# Patient Record
Sex: Male | Born: 1970 | Race: White | Hispanic: No | Marital: Married | Smoking: Former smoker
Health system: Southern US, Community
[De-identification: ages and names within clinical notes are randomized; demographics above are authoritative.]

## PROBLEM LIST (undated history)

## (undated) DIAGNOSIS — J40 Bronchitis, not specified as acute or chronic: Secondary | ICD-10-CM

---

## 2011-02-12 ENCOUNTER — Emergency Department (HOSPITAL_COMMUNITY): Payer: Managed Care, Other (non HMO)

## 2011-02-12 ENCOUNTER — Inpatient Hospital Stay (HOSPITAL_COMMUNITY)
Admission: EM | Admit: 2011-02-12 | Discharge: 2011-02-15 | DRG: 189 | Disposition: A | Discharge status: Home / Self Care | Payer: Managed Care, Other (non HMO) | Attending: Internal Medicine | Admitting: Internal Medicine

## 2011-02-12 DIAGNOSIS — F17200 Nicotine dependence, unspecified, uncomplicated: Secondary | ICD-10-CM | POA: Diagnosis present

## 2011-02-12 DIAGNOSIS — J9620 Acute and chronic respiratory failure, unspecified whether with hypoxia or hypercapnia: Principal | ICD-10-CM | POA: Diagnosis present

## 2011-02-12 DIAGNOSIS — J441 Chronic obstructive pulmonary disease with (acute) exacerbation: Secondary | ICD-10-CM | POA: Diagnosis present

## 2011-02-12 DIAGNOSIS — E662 Morbid (severe) obesity with alveolar hypoventilation: Secondary | ICD-10-CM | POA: Diagnosis present

## 2011-02-12 DIAGNOSIS — Z6841 Body Mass Index (BMI) 40.0 and over, adult: Secondary | ICD-10-CM

## 2011-02-12 DIAGNOSIS — G4733 Obstructive sleep apnea (adult) (pediatric): Secondary | ICD-10-CM | POA: Diagnosis present

## 2011-02-12 DIAGNOSIS — T380X5A Adverse effect of glucocorticoids and synthetic analogues, initial encounter: Secondary | ICD-10-CM | POA: Diagnosis present

## 2011-02-12 DIAGNOSIS — R7309 Other abnormal glucose: Secondary | ICD-10-CM | POA: Diagnosis present

## 2011-02-12 DIAGNOSIS — R609 Edema, unspecified: Secondary | ICD-10-CM | POA: Diagnosis present

## 2011-02-12 DIAGNOSIS — I517 Cardiomegaly: Secondary | ICD-10-CM | POA: Diagnosis present

## 2011-02-12 LAB — CBC
HCT: 52.3 % — ABNORMAL HIGH (ref 39.0–52.0)
MCHC: 32.3 g/dL (ref 30.0–36.0)
Platelets: 183 10*3/uL (ref 150–400)
RDW: 14.4 % (ref 11.5–15.5)
WBC: 13.2 10*3/uL — ABNORMAL HIGH (ref 4.0–10.5)

## 2011-02-12 LAB — BASIC METABOLIC PANEL
BUN: 10 mg/dL (ref 6–23)
CO2: 38 mEq/L — ABNORMAL HIGH (ref 19–32)
Calcium: 9.3 mg/dL (ref 8.4–10.5)
Creatinine, Ser: 0.9 mg/dL (ref 0.4–1.5)
GFR calc non Af Amer: >60 mL/min (ref >60)
Glucose, Bld: 109 mg/dL — ABNORMAL HIGH (ref 70–99)
Sodium: 137 mEq/L (ref 135–145)

## 2011-02-12 LAB — DIFFERENTIAL
Basophils Absolute: 0 10*3/uL (ref 0.0–0.1)
Basophils Relative: 0 % (ref 0–1)
Eosinophils Absolute: 0.1 10*3/uL (ref 0.0–0.7)
Eosinophils Relative: 1 % (ref 0–5)
Lymphocytes Relative: 12 % (ref 12–46)
Monocytes Absolute: 1.6 10*3/uL — ABNORMAL HIGH (ref 0.1–1.0)

## 2011-02-12 LAB — BLOOD GAS, ARTERIAL
Acid-Base Excess: 9.5 mmol/L — ABNORMAL HIGH (ref 0.0–2.0)
Bicarbonate: 37.3 mEq/L — ABNORMAL HIGH (ref 20.0–24.0)
Bicarbonate: 35.7 mEq/L — ABNORMAL HIGH (ref 20.0–24.0)
Delivery systems: POSITIVE
Expiratory PAP: 5
O2 Saturation: 94.8 %
Patient temperature: 37
Patient temperature: 37
RATE: 16 resp/min
TCO2: 82.4 mmol/L (ref 0–100)
pO2, Arterial: 82.4 mmHg (ref 80.0–100.0)

## 2011-02-12 LAB — PRO B NATRIURETIC PEPTIDE: Pro B Natriuretic peptide (BNP): 828.9 pg/mL — ABNORMAL HIGH (ref 0–125)

## 2011-02-12 LAB — CK TOTAL AND CKMB (NOT AT ARMC)
CK, MB: 5.2 ng/mL — ABNORMAL HIGH (ref 0.3–4.0)
Total CK: 150 U/L (ref 7–232)

## 2011-02-12 MED ORDER — IOHEXOL 350 MG/ML SOLN
120.0000 mL | Freq: Once | INTRAVENOUS | Status: AC | PRN
  Administered 2011-02-12: 120 mL via INTRAVENOUS

## 2011-02-13 ENCOUNTER — Inpatient Hospital Stay (HOSPITAL_COMMUNITY): Payer: Managed Care, Other (non HMO)

## 2011-02-13 DIAGNOSIS — I517 Cardiomegaly: Secondary | ICD-10-CM

## 2011-02-13 LAB — BLOOD GAS, ARTERIAL
Acid-Base Excess: 11.8 mmol/L — ABNORMAL HIGH (ref 0.0–2.0)
Bicarbonate: 37.8 mEq/L — ABNORMAL HIGH (ref 20.0–24.0)
Expiratory PAP: 6
Mode: POSITIVE
Patient temperature: 37

## 2011-02-13 LAB — BASIC METABOLIC PANEL
GFR calc non Af Amer: >60 mL/min (ref >60)
Glucose, Bld: 163 mg/dL — ABNORMAL HIGH (ref 70–99)
Potassium: 4.6 mEq/L (ref 3.5–5.1)
Sodium: 139 mEq/L (ref 135–145)

## 2011-02-13 LAB — CARDIAC PANEL(CRET KIN+CKTOT+MB+TROPI)
CK, MB: 5.5 ng/mL — ABNORMAL HIGH (ref 0.3–4.0)
CK, MB: 4.1 ng/mL — ABNORMAL HIGH (ref 0.3–4.0)
Relative Index: 4.1 — ABNORMAL HIGH (ref 0.0–2.5)
Total CK: 134 U/L (ref 7–232)
Total CK: 98 U/L (ref 7–232)
Troponin I: <0.3 ng/mL (ref <0.30)

## 2011-02-13 LAB — DIFFERENTIAL
Eosinophils Relative: 0 % (ref 0–5)
Lymphocytes Relative: 6 % — ABNORMAL LOW (ref 12–46)
Lymphs Abs: 0.7 10*3/uL (ref 0.7–4.0)
Monocytes Absolute: 0.3 10*3/uL (ref 0.1–1.0)
Monocytes Relative: 3 % (ref 3–12)

## 2011-02-13 LAB — HEMOGLOBIN A1C
Hgb A1c MFr Bld: 6.5 % — ABNORMAL HIGH (ref <5.7)
Mean Plasma Glucose: 140 mg/dL — ABNORMAL HIGH (ref <117)

## 2011-02-13 LAB — LIPID PANEL
HDL: 25 mg/dL — ABNORMAL LOW (ref >39)
LDL Cholesterol: 114 mg/dL — ABNORMAL HIGH (ref 0–99)

## 2011-02-13 LAB — URINALYSIS, ROUTINE W REFLEX MICROSCOPIC
Bilirubin Urine: NEGATIVE
Ketones, ur: NEGATIVE mg/dL
Specific Gravity, Urine: 1.01 (ref 1.005–1.030)
pH: 5 (ref 5.0–8.0)

## 2011-02-13 LAB — TSH: TSH: 1.884 u[IU]/mL (ref 0.350–4.500)

## 2011-02-13 LAB — URINE MICROSCOPIC-ADD ON

## 2011-02-13 LAB — CBC
HCT: 54.5 % — ABNORMAL HIGH (ref 39.0–52.0)
MCH: 30.4 pg (ref 26.0–34.0)
MCHC: 31 g/dL (ref 30.0–36.0)
MCV: 98 fL (ref 78.0–100.0)
RDW: 14.3 % (ref 11.5–15.5)
WBC: 12 10*3/uL — ABNORMAL HIGH (ref 4.0–10.5)

## 2011-02-13 LAB — GLUCOSE, CAPILLARY
Glucose-Capillary: 183 mg/dL — ABNORMAL HIGH (ref 70–99)
Glucose-Capillary: 232 mg/dL — ABNORMAL HIGH (ref 70–99)
Glucose-Capillary: 180 mg/dL — ABNORMAL HIGH (ref 70–99)

## 2011-02-13 LAB — RAPID URINE DRUG SCREEN, HOSP PERFORMED
Cocaine: NOT DETECTED
Opiates: NOT DETECTED

## 2011-02-14 LAB — BLOOD GAS, ARTERIAL
Bicarbonate: 38.1 mEq/L — ABNORMAL HIGH (ref 20.0–24.0)
Inspiratory PAP: 8
O2 Saturation: 95.3 %
TCO2: 33.1 mmol/L (ref 0–100)
pO2, Arterial: 84.8 mmHg (ref 80.0–100.0)

## 2011-02-14 LAB — GLUCOSE, CAPILLARY: Glucose-Capillary: 288 mg/dL — ABNORMAL HIGH (ref 70–99)

## 2011-02-14 LAB — CBC
HCT: 52.4 % — ABNORMAL HIGH (ref 39.0–52.0)
Hemoglobin: 16.2 g/dL (ref 13.0–17.0)
MCV: 98.5 fL (ref 78.0–100.0)
WBC: 15.5 10*3/uL — ABNORMAL HIGH (ref 4.0–10.5)

## 2011-02-14 LAB — BASIC METABOLIC PANEL
BUN: 21 mg/dL (ref 6–23)
Chloride: 95 mEq/L — ABNORMAL LOW (ref 96–112)
Glucose, Bld: 161 mg/dL — ABNORMAL HIGH (ref 70–99)
Potassium: 4.6 mEq/L (ref 3.5–5.1)

## 2011-02-14 LAB — DIFFERENTIAL
Basophils Absolute: 0 10*3/uL (ref 0.0–0.1)
Lymphocytes Relative: 5 % — ABNORMAL LOW (ref 12–46)
Lymphs Abs: 0.8 10*3/uL (ref 0.7–4.0)
Monocytes Absolute: 0.4 10*3/uL (ref 0.1–1.0)
Neutro Abs: 14.2 10*3/uL — ABNORMAL HIGH (ref 1.7–7.7)

## 2011-02-14 NOTE — H&P (Signed)
Cristian Rubio, DENTINGER               ACCOUNT NO.:  [id number]  MEDICAL RECORD NO.:  [id number]  LOCATION:  IC09                          FACILITY:  [facility]  PHYSICIAN:  Osvaldo Shipper, MD     DATE OF BIRTH:  01/05/1971  DATE OF ADMISSION:  02/12/2011 DATE OF DISCHARGE:  LH                             HISTORY & PHYSICAL   PRIMARY CARE PHYSICIAN:  Corrie Mckusick, MD  ADMISSION DIAGNOSES: 1. Acute chronic obstructive pulmonary exacerbation. 2. Acute respiratory failure  3. Morbid obesity. 4. Chronic respiratory failure. 5. Lower extremity edema and mild cardiomegaly.  CHIEF COMPLAINT:  Shortness of breath and chest pain.  HISTORY OF PRESENT ILLNESS:  The patient is a 40 year old Caucasian male who is morbidly obese who apparently has been having some difficulty breathing and chest pain for the last few days and also maybe the shortness of breath has been there for last few weeks.  The patient is currently unarousable and so history was obtained from the ED physician notes and from the ED chart.  His shortness of breath was present along with some orthopnea and weight gain.  He recently had quit smoking. There was history of travel back in April.  The chest pain was sharp in nature without any radiation.  It was localized in the mid part of the chest, increasing with deep breathing and cough.  There is also some history suggestive of dyspnea on exertion.  So once again history is limited because the patient currently is unresponsive.  His mother is at bedside, however, she does not know many details as well his wife is not available at this time.  MEDICATIONS AT HOME:  Mucinex and Motrin as needed.  ALLERGIES:  PENICILLIN.  PAST MEDICAL HISTORY:  Really unremarkable per the reports.  SURGICAL HISTORY:  Unknown.  SOCIAL HISTORY:  Lives with his wife.  He still smokes or maybe quit smoking recently.  No history of any alcohol use or illicit drug use.  FAMILY HISTORY:  Positive  for diabetes and some unspecified heart disease.  REVIEW OF SYSTEMS:  Unable to do because of the patient's mental status.  PHYSICAL EXAMINATION:  VITAL SIGNS:  Temperature recorded initially at 99.0, blood pressure 160/104, heart rate 110, respiratory rate 32, saturation 96% on O2.  It was initially 75-80% on room air when he initially came in. GENERAL:  This is an morbidly obese white male unresponsive currently. HEENT:  Head is normocephalic, atraumatic.  Pupils are equal, reacting. No pallor, no icterus.  Oral mucous membranes difficult to examine. NECK:  Obese.  No thyromegaly appreciated.  No cervical, supraclavicular, or inguinal lymphadenopathy is present. LUNGS:  Reveal rhonchi and wheezing bilaterally.  No definite crackles. CARDIOVASCULAR:  S1, S2 is normal, regular.  No systolic murmur appreciated.  No other murmurs appreciated.  No rubs or bruits.  Some pedal edema is noted but not significant, maybe about 1+. ABDOMEN:  Obese.  No obvious tenderness is present.  No obvious masses are appreciated. GU:  Deferred. MUSCULOSKELETAL:  Normal muscle, mass, and tone. NEUROLOGIC:  He is unresponsive. SKIN:  Does not reveal any rashes.  LABORATORY DATA:  His ABG showed a pH of 7.27,  pCO2 is 83, pO2 is 82, bicarb is 37, saturation 94%.  White cell count is 13.2, hemoglobin is 16.9, platelet count is 183,000.  His electrolytes are normal.  His chloride actually is 94, bicarb is 38.  His anion gap is normal.  His BUN and creatinine are normal.  LFTs are not done.  His cardiac enzymes showed total CK which was normal.  CK-MB is 5.2, relative index 3.5, troponin 0.3.  BNP 828.  He had initially a chest x-ray which showed cardiomegaly and pulmonary vascular congestion.  Subsequently, he had a CT angio which showed mild cardiomegaly.  No PE and atelectasis.  He had subsequently EKG as well which shows sinus tachycardia at 113 with normal axis.  Intervals appear to be in the normal  range.  He has got a prominent P-wave, no definite Q- waves, no concerning ST or T-wave changes are noted.  He does have poor R-wave progression.  ASSESSMENT (PLEASE SEE ADDENDUM):  This is a 40 year old morbidly obese white male who presents with few week history of cough, shortness of breath, and chest pain for the last few days.  It appears that the patient has acute chronic obstructive pulmonary exacerbation with acute respiratory failure.  He is currently unresponsive.  We tried about 15-20 minutes of BiPAP with no response.  He may need to be intubated and put on mechanical ventilation.  He is morbidly obese.  It is conceivable that he has obesity hypoventilation syndrome and could also have obstructive sleep apnea as well.  He has been a smoker.  The chest pain was pleuritic based on notes and so it could be related to respiratory illness.  However, he also has cardiomegaly and some pedal edema and so heart disease cannot be ruled out. 1. Acute chronic obstructive pulmonary disease exacerbation with     respiratory failure.  He may need to be intubated.  He will be given     nebulizer treatment, steroids, and antibiotics.  Inhaled steroids     will also be considered in this patient.  Dr. Juanetta Gosling will be     consulted in the morning. 2. Cardiomegaly.  Echocardiogram will be obtained to give [place] more     information.  Cardiac enzymes will be cycled. 3. Leukocytosis.  He is minimally febrile.  He will be put on     antibiotics.  Blood cultures will be obtained as well. 4. Unresponsiveness is due to CO2 narcosis. 5. Deep venous thrombosis prophylaxis will be initiated.  He is a full code.  Further management decisions will depend on results of further testing and patient's response to treatment.  ADDENDUM: PLEASE NOTE THAT BEFORE PATIENT COULD BE INTUBATED HE SUDDENLY WOKE UP. INTUBATION WAS AVOIDED AND PATIENT WAS ADMITTED TO THE ICU ON BIPAP.     Osvaldo Shipper,  MD     GK/MEDQ  D:  02/12/2011  T:  02/12/2011  Job:  [id number]  cc:   Corrie Mckusick, M.D. Fax: [phone]  Oneal Deputy. Juanetta Gosling, M.D. Fax: [phone]  Electronically Signed by Osvaldo Shipper MD on 02/13/2011 11:58:38 PM

## 2011-02-15 LAB — BASIC METABOLIC PANEL
CO2: 41 mEq/L (ref 19–32)
Chloride: 97 mEq/L (ref 96–112)
GFR calc non Af Amer: >60 mL/min (ref >60)
Glucose, Bld: 119 mg/dL — ABNORMAL HIGH (ref 70–99)
Potassium: 4.2 mEq/L (ref 3.5–5.1)
Sodium: 142 mEq/L (ref 135–145)

## 2011-02-15 LAB — BLOOD GAS, ARTERIAL
Acid-Base Excess: 12.4 mmol/L — ABNORMAL HIGH (ref 0.0–2.0)
pCO2 arterial: 74.3 mmHg (ref 35.0–45.0)
pH, Arterial: 7.335 — ABNORMAL LOW (ref 7.350–7.450)

## 2011-02-15 LAB — GLUCOSE, CAPILLARY

## 2011-02-15 NOTE — Group Therapy Note (Signed)
  NAME:  Cristian Rubio, Cristian Rubio               ACCOUNT NO.:  [id number]  MEDICAL RECORD NO.:  [id number]  LOCATION:  IC09                          FACILITY:  [facility]  PHYSICIAN:  Blaze L. Juanetta Gosling, M.D.DATE OF BIRTH:  April 28, 1971  DATE OF PROCEDURE: DATE OF DISCHARGE:                                PROGRESS NOTE   Patient of the [facility].  Cristian Rubio is being treated for COPD exacerbation for what appears to be some element of congestive heart failure, sleep apnea and seems to be improving.  His echocardiogram showed mild concentric left ventricular hypertrophy, but normal systolic ventricular function.  I am following more peripherally as he says he is getting ready to go home.     Delson L. Juanetta Gosling, M.D.     ELH/MEDQ  D:  02/15/2011  T:  02/15/2011  Job:  [id number]  Electronically Signed by Kari Baars M.D. on 02/15/2011 01:42:02 PM

## 2011-02-15 NOTE — Consult Note (Signed)
  NAME:  VAHE, PIENTA               ACCOUNT NO.:  [id number]  MEDICAL RECORD NO.:  [id number]  LOCATION:  IC09                          FACILITY:  [facility]  PHYSICIAN:  Arav L. Juanetta Gosling, M.D.DATE OF BIRTH:  01-20-1971  DATE OF CONSULTATION: DATE OF DISCHARGE:                                CONSULTATION   Mr. Naval is a patient of the [facility], who was admitted with COPD.  He is 40 years old, morbidly obese.  He has had increasing chest discomfort and shortness of breath.  He has had sharp chest pain in the middle part of his chest.  He has a history of bronchitis.  I am not sure if he has been diagnosed with COPD, but he does have a history of smoking as much as 3 packages of cigarettes daily, although now apparently, he is down to about one-third pack of cigarettes.  PAST MEDICAL HISTORY:  Essentially unremarkable, except for obesity.  FAMILY HISTORY:  Positive for COPD.  His grandfather died of COPD and his daughter has asthma.  SOCIAL HISTORY:  He lives at home with his wife.  He is trying to stop smoking, but has not been able to do so.  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  REVIEW OF SYSTEMS:  Except as mentioned shows a morbidly obese male who is on BiPAP, but fully arousable.  After discussion with his wife, he does have periods where he is difficult to arouse and based on his body habitus and history, I think he probably does have sleep apnea.  PHYSICAL EXAMINATION:  HEENT:  His pupils are reactive.  Nose and throat are clear.  Mucous membranes are moist. CHEST:  Shows minimal wheeze bilaterally. HEART:  Regular without murmur, gallop, or rub now. ABDOMEN:  Soft, obese without masses.  Bowel sounds present and active. CENTRAL NERVOUS SYSTEM:  Shows that he is awake. NECK:  Supple. HEART:  Regular. EXTREMITIES:  Showed no edema.  LABORATORY DATA:  CT chest done and showed mild cardiomegaly, no evidence of acute  pulmonary embolus, and also showed atelectasis.  Assessment then is that he has been able to actually come off of BiPAP this morning.  He is on nasal oxygen.  He has morbid obesity.  He has what I think is probably a chronic obstructive pulmonary disease exacerbation and almost certainly he has sleep apnea.  He is going to need to have a sleep study done, at some point, I would continue with his current treatments otherwise and have him probably started on chronic obstructive pulmonary disease depending on the results of his sleep study.  Thanks for allowing me to see him with you.     Xaiver L. Juanetta Gosling, M.D.     ELH/MEDQ  D:  02/13/2011  T:  02/13/2011  Job:  [id number]  Electronically Signed by Kari Baars M.D. on 02/15/2011 01:41:43 PM

## 2011-02-15 NOTE — Group Therapy Note (Signed)
  NAME:  Cristian Rubio               ACCOUNT NO.:  [id number]  MEDICAL RECORD NO.:  [id number]  LOCATION:  IC09                          FACILITY:  [facility]  PHYSICIAN:  Jacorian L. Juanetta Gosling, M.D.DATE OF BIRTH:  01-15-1971  DATE OF PROCEDURE: DATE OF DISCHARGE:                                PROGRESS NOTE   Patient of [facility].  SUBJECTIVE:  Cristian Rubio says he is doing better.  He slept pretty well last night with BiPAP.  He was asking about results of his evaluations and I have explained to him as best I can, and explained to him that his problem is that he has COPD and sleep apnea, morbid obesity with obesity/hypoventilation.  PHYSICAL EXAMINATION:  GENERAL:  His exam this morning shows that he is awake and alert. CHEST:  Shows decreased breath sounds,are relatively clear. VITAL SIGNS:  Blood pressure running in the 120 systolic, pulse in the 90s.  His blood gas showed a pO2 of 84, pCO2 of 76.5, pH 7.31, this on an auto BiPAP.  White count is 15,500, hemoglobin is 16.2, and his BMET shows a CO2 of 40 suggesting his chronic CO2 retention.  My assessment then is that he has severe sleep apnea, obesity, hypoventilation.  He has chronic obstructive pulmonary disease.  He says he is going to stop smoking.  We discussed weight loss and I am going to see if we can get him bilevel positive airway pressure at home once we scheduled these sleep study.     Efrem L. Juanetta Gosling, M.D.     ELH/MEDQ  D:  02/14/2011  T:  02/14/2011  Job:  [id number]  Electronically Signed by Kari Baars M.D. on 02/15/2011 01:41:57 PM

## 2011-02-18 NOTE — Discharge Summary (Signed)
Cristian Rubio, Cristian Rubio               ACCOUNT NO.:  [id number]  MEDICAL RECORD NO.:  [id number]  LOCATION:  IC09                          FACILITY:  [facility]  PHYSICIAN:  Elliot Cousin, M.D.    DATE OF BIRTH:  1971/07/23  DATE OF ADMISSION:  02/12/2011 DATE OF DISCHARGE:  06/13/2012LH                              DISCHARGE SUMMARY   DISCHARGE DIAGNOSES: 1. Acute chronic obstructive pulmonary disease with bronchitic     exacerbation, acute on chronic respiratory failure,     probable obstructive sleep apnea, and obesity - hypoventilation     syndrome.  The patient was treated with BiPAP and oxygen.  His ABG     on 3 liters of oxygen prior to discharge revealed a pH of 7.33,     pCO2 of 74.3, and pO2 of 66. 2. Cardiomegaly, pulmonary vascular congestion, and right ventricular     hypertrophy.  The patient's ejection fraction per the 2-D     echocardiogram on February 13, 2011 was 55-60%. 3. Steroid-induced hyperglycemia.  The patient is at risk of type 2     diabetes mellitus.  His hemoglobin A1c was 6.5. 4. Peripheral edema, likely secondary to chronic pulmonary disease and     possibly right heart failure or diastolic dysfunction. 5. Morbid obesity. 6. Tobacco abuse.  DISCHARGE MEDICATIONS: 1. BiPAP 6/12 nightly. 2. Oxygen 3.5 liters per minute 24 hours daily. 3. Advair 250/50 one puff b.i.d. 4. Lasix 20 mg one tablet every other day. 5. Combivent inhaler two puffs three times daily and then every 4     hours as needed for chest congestion and wheezing. 6. Levaquin 500 mg daily for three more days. 7. Prednisone taper to be tapered over the next 5 days as directed. 8. Motrin 200 mg two tablets every 8 hours as needed for pain.  DISCHARGE DISPOSITION:  The patient was discharged to home in improved and stable condition on February 15, 2011.  He has an appointment to follow up with his primary care physician, Dr. Phillips Odor on February 22, 2011 at 3:45 p.m. and with pulmonologist, Dr. Juanetta Gosling on  February 23, 2011 at 3 o'clock p.m.  CONSULTATIONS:  Lean L. Juanetta Gosling, MD  PROCEDURE PERFORMED: 1. Application of BiPAP. 2. A 2-D echocardiogram on February 13, 2011.  The results revealed mild     concentric left ventricular hypertrophy.  Ejection fraction was in     the range of 55-60%.  Right ventricular cavity size was normal.     Wall thickness was mildly-to-moderately increased.  Right atrium     was mildly dilated.  Trivial pericardial effusion was identified     posterior to the heart. 3. CT angiogram of the chest on February 12, 2011.  The results revealed     mild cardiomegaly, no evidence for acute pulmonary embolus, and     atelectasis. 4. Chest x-ray on February 13, 2011.  The results revealed cardiomegaly,     bibasilar atelectasis, and pulmonary vascular congestion.  HISTORY OF PRESENTING ILLNESS:  The patient is a 40 year old man who presented to the emergency department on February 12, 2011, with a chief complaint of shortness of breath and associated chest discomfort.  He had recently quit smoking, but still craved cigarettes.  In the emergency department, the patient was minimally responsive.  His initial ABG on 5 liters of oxygen revealed a pH of 7.27, pCO2 of 83.4, and pO2 of 82.4.  His chest x-ray revealed cardiomegaly and pulmonary vascular congestion.  It was believed that he needed to be intubated.  However in the interim, the patient became more responsive and alert.  He was therefore started on BiPAP instead.  He was admitted for further evaluation and management.  HOSPITAL COURSE:  The patient was admitted to the ICU on BiPAP.  Steroid therapy was started with Solu-Medrol intravenously.  Antibiotic therapy was started with Levaquin intravenously.  Oxygen was titrated to keep his oxygen saturations 88-92%.  BiPAP settings were modified as needed by the respiratory therapist.  Albuterol and Atrovent nebulizations were administered every 4 hours.  Advair was started b.i.d.  as well. Pulmonologist, Dr. Juanetta Gosling was consulted.  He agreed with medical management.  He recommended monitoring the patient's pulmonary status and ABGs daily.  Following 24 hours of BiPAP, the patient's ABG revealed a pH of 7.3, pCO2 of 71.6, and pO2 of 63.5.  For further evaluation, number of studies were ordered.  His cardiac enzymes were within normal limits and therefore he ruled out for a myocardial infarction.  His fasting lipid profile revealed a total cholesterol of 157, triglycerides of 88, HDL-cholesterol of 25, and LDL- cholesterol of 114.  His TSH was within normal limits at 1.8.  His initial BNP was 829, but the follow up BNP increased to 967.  Because of his peripheral edema, he was started empirically on intravenous Lasix.  A 2-D echocardiogram was ordered to evaluate for right heart failure.  The results were dictated above.  It was noted that the patient did have mild-to-moderate right ventricular hypertrophy, which was likely secondary to his chronic lung disease.  A CT angiogram of his chest was ordered as well to rule out PE.  It was negative for PE.  The patient ambulated in the unit on room air on February 14, 2011.  His oxygen saturations dropped to 78-80%.  It was obvious that the patient had both hypoxic and hypercapnic respiratory failure.  Oxygen was titrated to 3-4 liters per minute to keep his oxygen saturations in the upper 80s to low 90s.  He improved symptomatically.  Solu-Medrol was tapered off in favor of prednisone.  BiPAP settings were adjusted for home.  Both albuterol and Atrovent nebulizers were transitioned to the handheld inhaler.  He was noted to have episodes of sleep apnea during sleep.  An outpatient sleep study was going to be arranged; however, the patient refused.  Because he needs BiPAP, he does not need an official sleep study.  The BiPAP is needed to decrease his pCO2 and to improve his oxygenation and therefore, he was sent home on  BiPAP instead of CPAP.  The patient's venous glucose increased on steroid therapy.  He was treated appropriately with sliding scale NovoLog and Lantus.  He had no prior history of diabetes mellitus.  He does have a family history of diabetes mellitus.  His hemoglobin A1c was noted to be 6.5.  He was counseled on a carbohydrate modified diet.  I contemplated starting oral therapy; however, the patient did not want it.  He wanted to lose weight not only to reduce his chances of full-blown diabetes, but also to hopefully come off of BiPAP and oxygen.  The registered dietitian/nutritionist was consulted.  She provided him with healthy meal planning choices, etc.     Elliot Cousin, M.D.     DF/MEDQ  D:  02/15/2011  T:  02/15/2011  Job:  [id number]  Electronically Signed by Elliot Cousin M.D. on 02/18/2011 07:44:11 AM

## 2011-02-28 ENCOUNTER — Ambulatory Visit (HOSPITAL_COMMUNITY)
Admission: RE | Admit: 2011-02-28 | Discharge: 2011-02-28 | Disposition: A | Discharge status: Home / Self Care | Payer: Managed Care, Other (non HMO) | Source: Ambulatory Visit | Attending: Pulmonary Disease | Admitting: Pulmonary Disease

## 2011-02-28 DIAGNOSIS — R0609 Other forms of dyspnea: Secondary | ICD-10-CM | POA: Insufficient documentation

## 2011-02-28 DIAGNOSIS — R0989 Other specified symptoms and signs involving the circulatory and respiratory systems: Secondary | ICD-10-CM | POA: Insufficient documentation

## 2012-03-11 ENCOUNTER — Emergency Department (HOSPITAL_COMMUNITY)
Admission: EM | Admit: 2012-03-11 | Discharge: 2012-03-11 | Disposition: A | Discharge status: Home / Self Care | Payer: Managed Care, Other (non HMO) | Attending: Emergency Medicine | Admitting: Emergency Medicine

## 2012-03-11 ENCOUNTER — Encounter (HOSPITAL_COMMUNITY): Payer: Self-pay | Admitting: *Deleted

## 2012-03-11 ENCOUNTER — Emergency Department (HOSPITAL_COMMUNITY): Payer: Managed Care, Other (non HMO)

## 2012-03-11 DIAGNOSIS — R51 Headache: Secondary | ICD-10-CM | POA: Insufficient documentation

## 2012-03-11 DIAGNOSIS — F17200 Nicotine dependence, unspecified, uncomplicated: Secondary | ICD-10-CM | POA: Insufficient documentation

## 2012-03-11 HISTORY — DX: Bronchitis, not specified as acute or chronic: J40

## 2012-03-11 NOTE — ED Provider Notes (Signed)
History     CSN: [id number]  Arrival date & time 03/11/12  1654   First MD Initiated Contact with Patient 03/11/12 1751      Chief Complaint  Patient presents with  . Headache    (Consider location/radiation/quality/duration/timing/severity/associated sxs/prior treatment) HPI Comments: Patient and spouse report problems with a headache in the" back of the neck" accompanied with a" pop". The patient states that he has not had any injury to the head or neck. He's not had any previous surgery to the head or neck. The patient describes moving his neck is usual and felt a pop and then felt a headache that accompanied this 2 days ago. This went away but came back again for second time one day ago. The patient has had a headache in the back of his neck since that time. It is also of note that he had a temperature elevation of 101 the week prior to this happening. He's had some nausea but no vomiting. He's had no vision changes. There's been no unusual weakness reported. There's been no loss of consciousness. The patient states he really did not want to come to the emergency room, but was made to come by his spouse.  Patient is a 41 y.o. male presenting with headaches. The history is provided by the patient and the spouse.  Headache  Pertinent negatives include no palpitations and no shortness of breath.    Past Medical History  Diagnosis Date  . Bronchitis     History reviewed. No pertinent past surgical history.  History reviewed. No pertinent family history.  History  Substance Use Topics  . Smoking status: Current Everyday Smoker  . Smokeless tobacco: Not on file  . Alcohol Use: No      Review of Systems  Constitutional: Negative for activity change.       All ROS Neg except as noted in HPI  HENT: Negative for nosebleeds and neck pain.   Eyes: Negative for photophobia and discharge.  Respiratory: Positive for cough. Negative for shortness of breath and wheezing.     Cardiovascular: Negative for chest pain and palpitations.  Gastrointestinal: Negative for abdominal pain and blood in stool.  Genitourinary: Negative for dysuria, frequency and hematuria.  Musculoskeletal: Negative for back pain and arthralgias.  Skin: Negative.   Neurological: Positive for headaches. Negative for dizziness, seizures and speech difficulty.  Psychiatric/Behavioral: Negative for hallucinations and confusion.    Allergies  Penicillins  Home Medications  No current outpatient prescriptions on file.  BP 124/68  Pulse 76  Temp 98.8 F (37.1 C) (Oral)  Resp 18  Ht 5' 10.5" (1.791 m)  Wt 290 lb (131.543 kg)  BMI 41.02 kg/m2  SpO2 97%  Physical Exam  Nursing note and vitals reviewed. Constitutional: He is oriented to person, place, and time. He appears well-developed and well-nourished.  Non-toxic appearance.  HENT:  Head: Normocephalic.  Right Ear: Tympanic membrane and external ear normal.  Left Ear: Tympanic membrane and external ear normal.  Eyes: EOM and lids are normal. Pupils are equal, round, and reactive to light.  Neck: Normal range of motion. Neck supple. Carotid bruit is not present.       Mild to moderate soreness with range of motion of the neck. No rigidity present. No carotid bruits appreciated.  Cardiovascular: Normal rate, regular rhythm, normal heart sounds, intact distal pulses and normal pulses.   Pulmonary/Chest: Breath sounds normal. No respiratory distress.  Abdominal: Soft. Bowel sounds are normal. There is no tenderness. There  is no guarding.  Musculoskeletal: Normal range of motion.  Lymphadenopathy:       Head (right side): No submandibular adenopathy present.       Head (left side): No submandibular adenopathy present.    He has no cervical adenopathy.  Neurological: He is alert and oriented to person, place, and time. He has normal strength. No cranial nerve deficit or sensory deficit.  Skin: Skin is warm and dry.  Psychiatric: He  has a normal mood and affect. His speech is normal.    ED Course  Procedures (including critical care time)  Labs Reviewed - No data to display Ct Head Wo Contrast  03/11/2012  *RADIOLOGY REPORT*  Clinical Data: 41 year old male with acute onset of headache and pain.  CT HEAD WITHOUT CONTRAST  Technique:  Contiguous axial images were obtained from the base of the skull through the vertex without contrast.  Comparison: None.  Findings: Visualized paranasal sinuses and mastoids are clear.  No acute osseous abnormality identified.  Visualized orbits and scalp soft tissues are within normal limits.  Cerebral volume is within normal limits for age.  No midline shift, ventriculomegaly, mass effect, evidence of mass lesion, intracranial hemorrhage or evidence of cortically based acute infarction.  Gray-white matter differentiation is within normal limits throughout the brain.  No suspicious intracranial vascular hyperdensity.  IMPRESSION: Normal noncontrast CT appearance of the brain.  Original Report Authenticated By: Harley Hallmark, M.D.     1. Headache       MDM  I have reviewed nursing notes, vital signs, and all appropriate lab and imaging results for this patient. The CT scan of the head is negative. There no carotid bruits appreciated. There is no nuchal rigidity. The vital signs are stable. Results of the scan and examination discussed with the patient and the patient's spouse. Patient requests to use over-the-counter Excedrin or ibuprofen for his discomfort. Patient referred to neurology if headache symptoms are not improving. Patient is to return to the emergency department if needed.       Kathie Dike, [place] 03/11/12 1900

## 2012-03-11 NOTE — ED Notes (Signed)
Headache since Saturday, No injury, cold sx earlier in week, with fever 101, on Tuesday.  Nausea, .  Felt a "pop" in his head on Saturday.

## 2012-03-11 NOTE — ED Notes (Signed)
Pt returned from xray

## 2012-03-11 NOTE — ED Notes (Signed)
C/o headache for the past few days,

## 2012-03-14 ENCOUNTER — Emergency Department (HOSPITAL_COMMUNITY): Payer: Managed Care, Other (non HMO)

## 2012-03-14 ENCOUNTER — Emergency Department (HOSPITAL_COMMUNITY)
Admission: EM | Admit: 2012-03-14 | Discharge: 2012-03-14 | Disposition: A | Discharge status: Home / Self Care | Payer: Managed Care, Other (non HMO) | Attending: Emergency Medicine | Admitting: Emergency Medicine

## 2012-03-14 ENCOUNTER — Encounter (HOSPITAL_COMMUNITY): Payer: Self-pay | Admitting: *Deleted

## 2012-03-14 DIAGNOSIS — M542 Cervicalgia: Secondary | ICD-10-CM | POA: Insufficient documentation

## 2012-03-14 DIAGNOSIS — R6883 Chills (without fever): Secondary | ICD-10-CM | POA: Insufficient documentation

## 2012-03-14 DIAGNOSIS — F17200 Nicotine dependence, unspecified, uncomplicated: Secondary | ICD-10-CM | POA: Insufficient documentation

## 2012-03-14 DIAGNOSIS — R1110 Vomiting, unspecified: Secondary | ICD-10-CM | POA: Insufficient documentation

## 2012-03-14 DIAGNOSIS — R51 Headache: Secondary | ICD-10-CM | POA: Insufficient documentation

## 2012-03-14 LAB — CSF CELL COUNT WITH DIFFERENTIAL
RBC Count, CSF: 665 /mm3 — ABNORMAL HIGH
Tube #: 1
Tube #: 4

## 2012-03-14 LAB — PROTEIN AND GLUCOSE, CSF: Total  Protein, CSF: 36 mg/dL (ref 15–45)

## 2012-03-14 MED ORDER — DIAZEPAM 5 MG PO TABS: 5.0000 mg | ORAL_TABLET | Freq: Four times a day (QID) | ORAL | Status: AC | PRN

## 2012-03-14 MED ORDER — OXYCODONE-ACETAMINOPHEN 5-325 MG PO TABS: 1.0000 | ORAL_TABLET | Freq: Four times a day (QID) | ORAL | Status: AC | PRN

## 2012-03-14 NOTE — ED Notes (Signed)
Gave patient noon food tray, family at bedside. No other needs voiced at this time.

## 2012-03-14 NOTE — ED Notes (Signed)
Pt c/o headache and neck pain since last Saturday. Pt seen was seen in ED on Monday for same but patient states that he coughed this morning and heard his neck pop and the pain intensified.

## 2012-03-14 NOTE — ED Notes (Signed)
LP Tray and sterile gloves at bedside. Consent signed.

## 2012-03-14 NOTE — Procedures (Signed)
Preprocedure Dx: Headaches Postprocedure Dx: Headaches Procedure:  Fluoroscopically guided lumbar puncture Radiologist:  Tyron Russell Anesthesia:  1.5 ml of 1% lidocaine Specimen:  7.5 ml CSF, clear colorless  EBL:   None Opening pressure: 20.5 cm H2O Complications: None

## 2012-03-14 NOTE — ED Provider Notes (Signed)
History  This chart was scribed for [employer]. Rubin Payor, MD by Bennett Scrape. This patient was seen in room [facility] and the patient's care was started at 7:21AM.  CSN: [id number]  Arrival date & time 03/14/12  0711   First MD Initiated Contact with Patient 03/14/12 [phone]      Chief Complaint  Patient presents with  . Headache     The history is provided by the patient. No language interpreter was used.     Cristian Rubio is a 41 y.o. male who presents to the Emergency Department complaining of 5 days of sudden onset, gradually worsening, constant HA located frontally that radiates into the posterior neck started during sex. He reports that it came over like a wave after he felt a "pop" in his occipital region and fluctuates in intensity. He lists one episode of emesis and chills with the onset but denies them both currently. He was seen 4 days ago in this ED and had a negative CT scan. He was discharged home with muscle relaxers but pt has not taken them. He reports that he became concerned when he felt another "pop" in the occipital region with an increase in pain this morning while coughing. He reports taking one Excedrin with mild improvement PTA. He denies modifying factors stating that the pain is felt with standing, laying and sitting. He denies having prior episodes of similar symptoms. He denies fever, sore throat, visual disturbance, CP, cough, SOB, abdominal pain, nausea, diarrhea, urinary symptoms, back pain, weakness, numbness and rash as associated symptoms. He does not have a h/o chronic medical conditions. He is a current everyday smoker but denies alcohol use.   PCP is Dr. Phillips Odor.  Past Medical History  Diagnosis Date  . Bronchitis     History reviewed. No pertinent past surgical history.  History reviewed. No pertinent family history.  History  Substance Use Topics  . Smoking status: Current Everyday Smoker  . Smokeless tobacco: Not on file  . Alcohol Use:  No      Review of Systems  Constitutional: Positive for chills. Negative for fever.  HENT: Positive for neck pain. Negative for congestion and sore throat.   Eyes: Negative for visual disturbance.  Respiratory: Negative for cough and shortness of breath.   Cardiovascular: Negative for chest pain.  Gastrointestinal: Positive for vomiting. Negative for nausea, abdominal pain and diarrhea.  Genitourinary: Negative for dysuria and frequency.  Musculoskeletal: Negative for back pain.  Skin: Negative for rash.  Neurological: Positive for headaches. Negative for dizziness, weakness and numbness.    Allergies  Penicillins  Home Medications   Current Outpatient Rx  Name Route Sig Dispense Refill  . ALBUTEROL SULFATE HFA 108 (90 BASE) MCG/ACT IN AERS Inhalation Inhale 2 puffs into the lungs every 6 (six) hours as needed.    Marland Kitchen DIAZEPAM 5 MG PO TABS Oral Take 1 tablet (5 mg total) by mouth every 6 (six) hours as needed (spasm). 10 tablet 0  . OXYCODONE-ACETAMINOPHEN 5-325 MG PO TABS Oral Take 1-2 tablets by mouth every 6 (six) hours as needed for pain. 10 tablet 0    Triage Vitals: BP 129/93  Pulse 71  Temp 97.8 F (36.6 C) (Oral)  Resp 18  Ht 5' 10.5" (1.791 m)  Wt 290 lb (131.543 kg)  BMI 41.02 kg/m2  SpO2 96%  Physical Exam  Nursing note and vitals reviewed. Constitutional: He is oriented to person, place, and time. He appears well-developed and well-nourished. No distress.  HENT:  Head: Normocephalic and atraumatic.       Tenderness bilaterally at base at skull, no muscle spasms  Eyes: EOM are normal.  Neck: Neck supple. No tracheal deviation present.       No meningeal signs  Cardiovascular: Normal rate, regular rhythm and normal heart sounds.   Pulmonary/Chest: Effort normal and breath sounds normal. No respiratory distress.  Abdominal: Soft. He exhibits no distension.  Musculoskeletal: Normal range of motion. He exhibits no edema.  Neurological: He is alert and  oriented to person, place, and time. No cranial nerve deficit.  Skin: Skin is warm and dry.  Psychiatric: He has a normal mood and affect. His behavior is normal.    ED Course  LUMBAR PUNCTURE Date/Time: 03/14/2012 8:08 AM Performed by: Benjiman Core R. Authorized by: Billee Cashing Consent: Verbal consent obtained. Written consent not obtained. Risks and benefits: risks, benefits and alternatives were discussed Consent given by: patient Patient understanding: patient states understanding of the procedure being performed Patient consent: the patient's understanding of the procedure matches consent given Procedure consent: procedure consent matches procedure scheduled Relevant documents: relevant documents present and verified Test results: test results available and properly labeled Site marked: the operative site was marked Imaging studies: imaging studies available Required items: required blood products, implants, devices, and special equipment available Patient identity confirmed: verbally with patient and arm band Indications: evaluation for subarachnoid hemorrhage Anesthesia: local infiltration Local anesthetic: lidocaine 2% without epinephrine Anesthetic total: 2 ml Patient sedated: no Preparation: Patient was prepped and draped in the usual sterile fashion. Lumbar space: L3-L4 interspace Patient's position: sitting Needle gauge: 22 Needle type: diamond point Number of attempts: 1 Post-procedure: site cleaned Patient tolerance: Patient tolerated the procedure well with no immediate complications. Comments: Unsuccessful lumbar puncture.   (including critical care time)  COORDINATION OF CARE:  7:28AM-Discussed smoking cessation and pt states that he has no plans to stop. Discussed treatment plan which includes lumbar puncture and pt agreed to plan.  Results for orders placed during the hospital encounter of 03/14/12  CSF CELL COUNT WITH DIFFERENTIAL       Component Value Range   Tube # 1     Color, CSF COLORLESS  COLORLESS   Appearance, CSF CLEAR  CLEAR   Supernatant NOT APPLICABLE     RBC Count, CSF 665 (*) 0 /cu mm   WBC, CSF 1  0 - 5 /cu mm   Segmented Neutrophils-CSF TOO FEW TO COUNT, SMEAR AVAILABLE FOR REVIEW  0 - 6 %   Lymphs, CSF TOO FEW TO COUNT, SMEAR AVAILABLE FOR REVIEW  40 - 80 %   Monocyte-Macrophage-Spinal Fluid TOO FEW TO COUNT, SMEAR AVAILABLE FOR REVIEW  15 - 45 %   Eosinophils, CSF TOO FEW TO COUNT, SMEAR AVAILABLE FOR REVIEW  0 - 1 %   Other Cells, CSF       Value: RARE NEUTROPHIL, RARE LYMPH, RARE MONOCYTE SEEN ON SCAN  CSF CULTURE      Component Value Range   Specimen Description CSF     Special Requests NONE     Gram Stain       Value: CYTOSPIN = SQUAMOUS EPITHELIAL CELLS PRESENT WBC PRESENT, PREDOMINANTLY MONONUCLEAR NO ORGANISMS SEEN     Performed at [facility]   Culture PENDING     Report Status PENDING    PROTEIN AND GLUCOSE, CSF      Component Value Range   Glucose, CSF 62  43 - 76 mg/dL   Total  Protein, CSF 36  15 - 45 mg/dL  CSF CELL COUNT WITH DIFFERENTIAL      Component Value Range   Tube # 4     Color, CSF COLORLESS  COLORLESS   Appearance, CSF CLEAR  CLEAR   Supernatant NOT APPLICABLE     RBC Count, CSF 38 (*) 0 /cu mm   WBC, CSF 0  0 - 5 /cu mm   Segmented Neutrophils-CSF TOO FEW TO COUNT, SMEAR AVAILABLE FOR REVIEW  0 - 6 %   Lymphs, CSF TOO FEW TO COUNT, SMEAR AVAILABLE FOR REVIEW  40 - 80 %   Monocyte-Macrophage-Spinal Fluid TOO FEW TO COUNT, SMEAR AVAILABLE FOR REVIEW  15 - 45 %   Eosinophils, CSF TOO FEW TO COUNT, SMEAR AVAILABLE FOR REVIEW  0 - 1 %   Other Cells, CSF RARE MONOCYTE SEEN ON SCAN     Ct Head Wo Contrast  03/14/2012  *RADIOLOGY REPORT*  Clinical Data: Frontal headache.  Posterior neck pain.  CT HEAD WITHOUT CONTRAST  Technique:  Contiguous axial images were obtained from the base of the skull through the vertex without contrast.  Comparison: 03/11/2012  Findings:  There is no evidence for acute infarction, intracranial hemorrhage, mass lesion, hydrocephalus, or extra-axial fluid. There is no atrophy or white matter disease.  Calvarium is intact. Sinuses and mastoids are clear.  Compared to priors there is no change.  IMPRESSION: Negative exam.  Original Report Authenticated By: Elsie Stain, M.D.    Dg Lumbar Puncture Fluoro Guide  03/14/2012  *RADIOLOGY REPORT*  Clinical Data: Headache  FLUOROSCOPIC GUIDED LUMBAR PUNCTURE:  Technique: Written informed consent for fluoroscopic-guided lumbar puncture was obtained. Patient was placed prone. L4-L5 disc space was localized under fluoroscopy. Skin prepped and draped in usual sterile fashion. Skin and soft tissues anesthetized with 1.5 ml of 1% lidocaine. 22 gauge spinal needle advanced into spinal canal. Clear colorless CSF was encountered with opening pressure of 20.5 cm H2O. 7.5 ml of CSF was obtained in four tubes for requested laboratory analysis. Procedure tolerated well by patient without immediate complication.  Fluoroscopy time: 0.5 minutes  IMPRESSION: Fluoroscopic guided lumbar puncture as above.  Original Report Authenticated By: Lollie Marrow, M.D.     1. Headache       MDM  Patient with headache. He's had pain since last Saturday. It started acutely during intercourse. He has had pain with coughing since. He had a negative head CT a few days ago. CT was repeated and was still negative. Lumbar puncture was done by interventional radiology. He did not show infection or signs of bleed. Patient was discharged home with pain medication and muscle relaxer   I personally performed the services described in this documentation, which was scribed in my presence. The recorded information has been reviewed and considered.       Juliet Rude. Rubin Payor, MD 03/14/12 1610

## 2012-03-14 NOTE — ED Provider Notes (Signed)
Medical screening examination/treatment/procedure(s) were performed by non-physician practitioner and as supervising physician I was immediately available for consultation/collaboration.   Shelda Jakes, MD 03/14/12 1242

## 2012-03-17 LAB — CSF CULTURE W GRAM STAIN: Culture: NO GROWTH

## 2013-05-11 IMAGING — CT CT HEAD W/O CM
1 series · 16 of 30 positions shown, 20 images · non-contrast
Comparison: 03/11/2012

CLINICAL DATA: Frontal headache.  Posterior neck pain.

CT HEAD WITHOUT CONTRAST
TECHNIQUE: Contiguous axial images were obtained from the base of
the skull through the vertex without contrast.

[Series 2: headseq 4.8 h37s · axial · 0.43mm/px · z∈[+107,+273]mm · 16 of 36 slices shown, 20 images]
[im 2/36  brain]
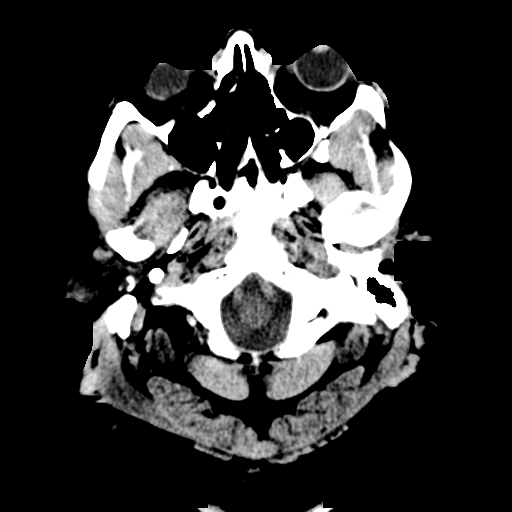
[im 2/36  bone]
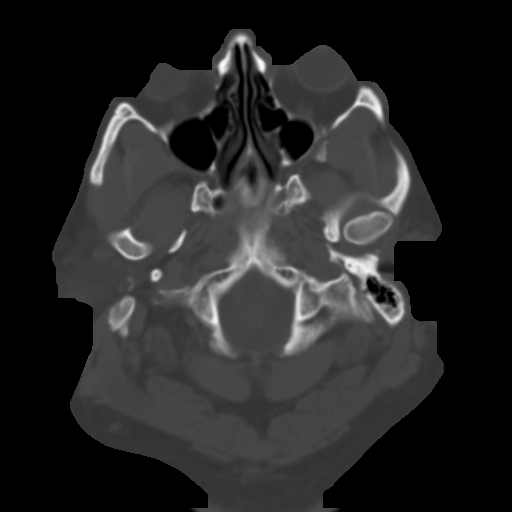
[im 4/36  brain]
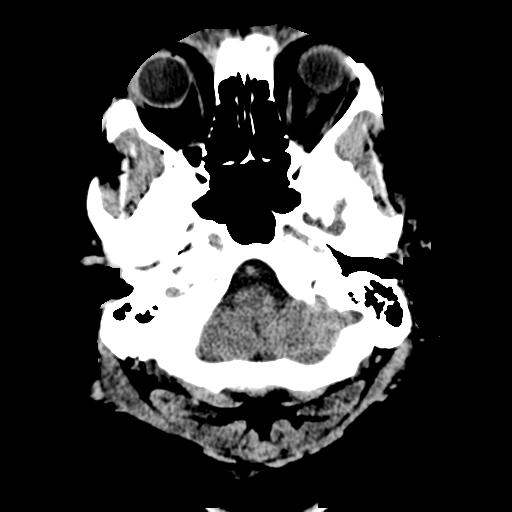
[im 7/36  brain]
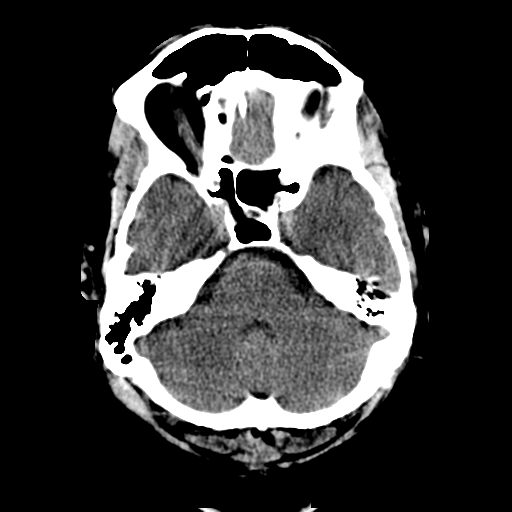
[im 9/36  brain]
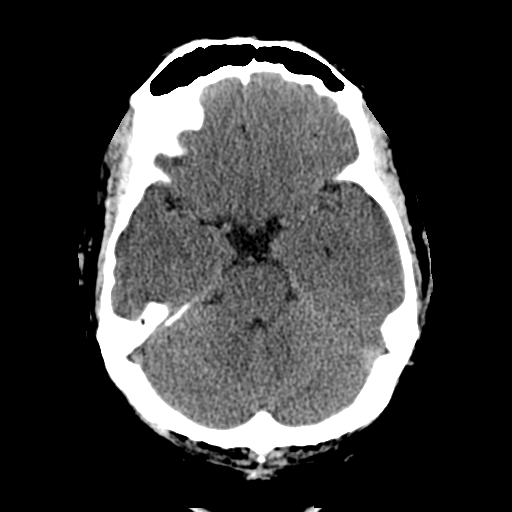
[im 10/36  brain]
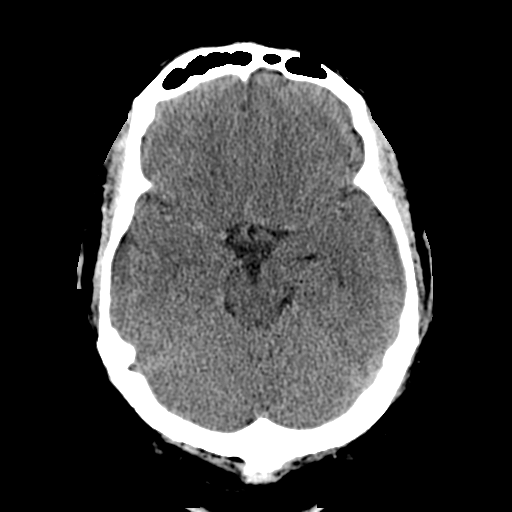
[im 10/36  bone]
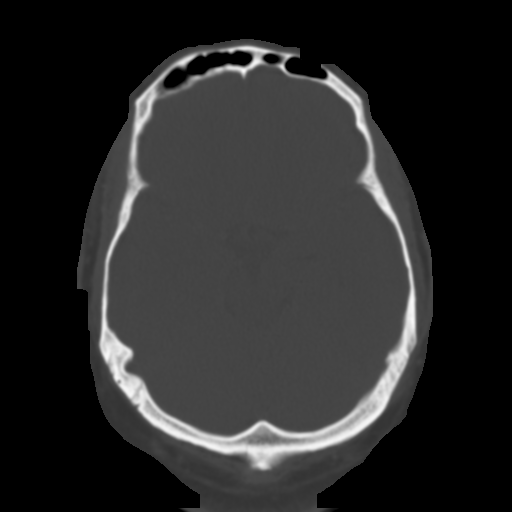
[im 13/36  brain]
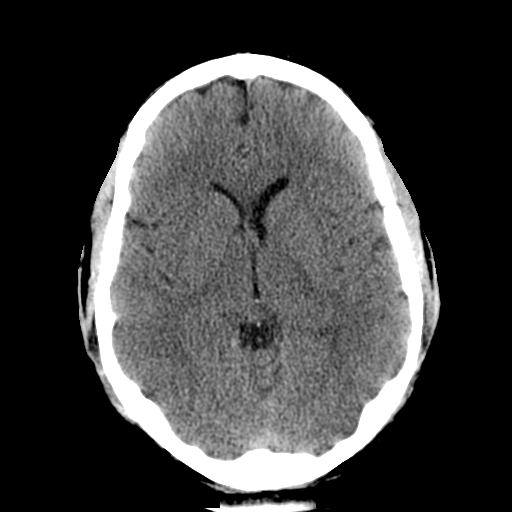
[im 15/36  brain]
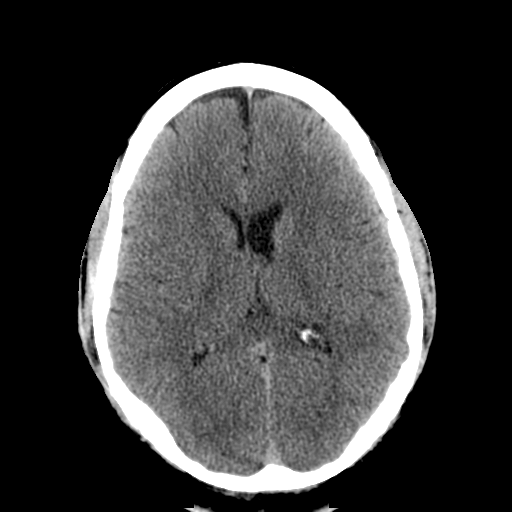
[im 17/36  brain]
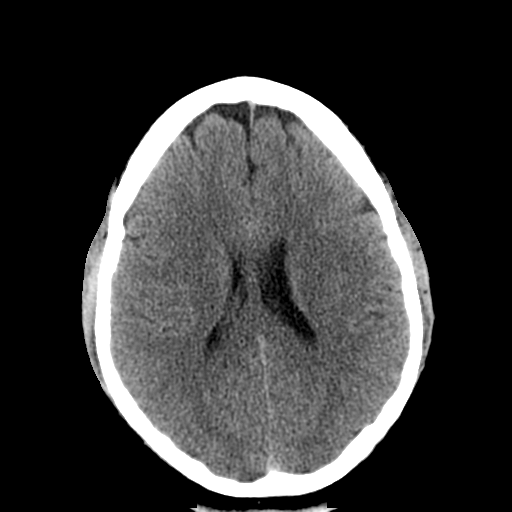
[im 19/36  brain]
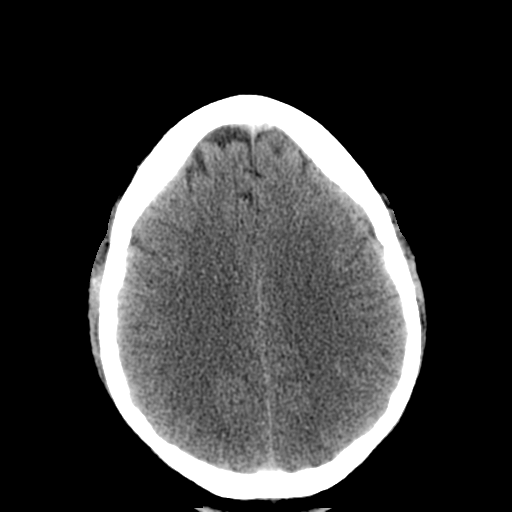
[im 19/36  bone]
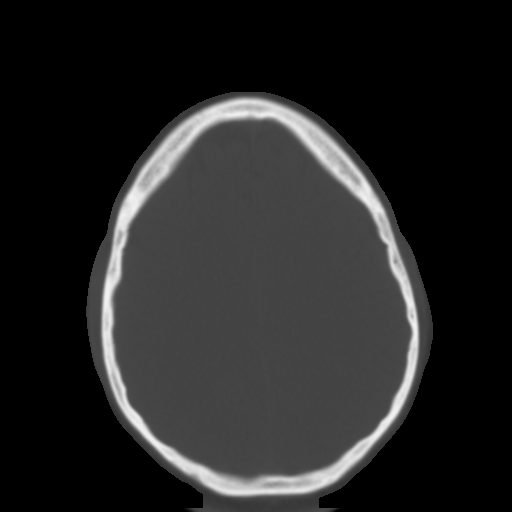
[im 21/36  brain]
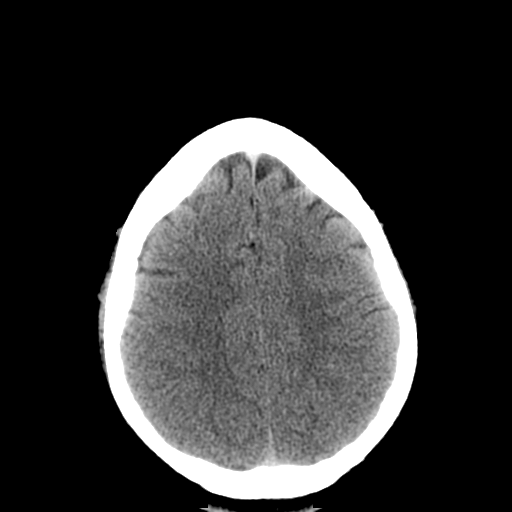
[im 23/36  brain]
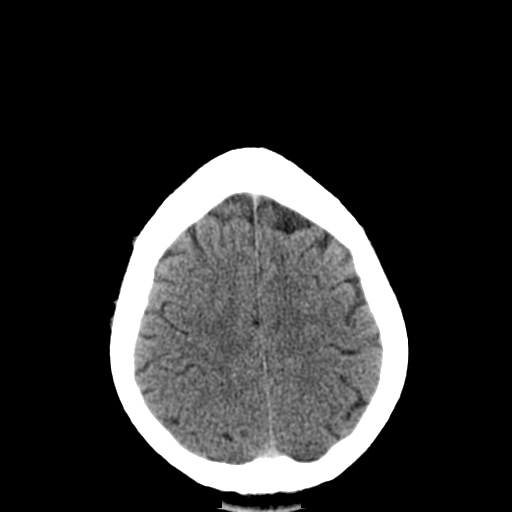
[im 26/36  brain]
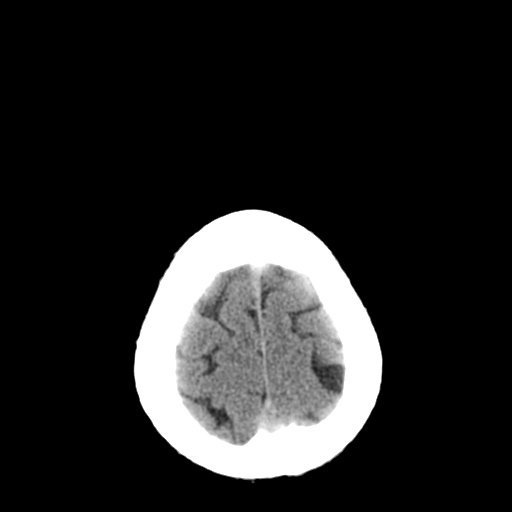
[im 27/36  brain]
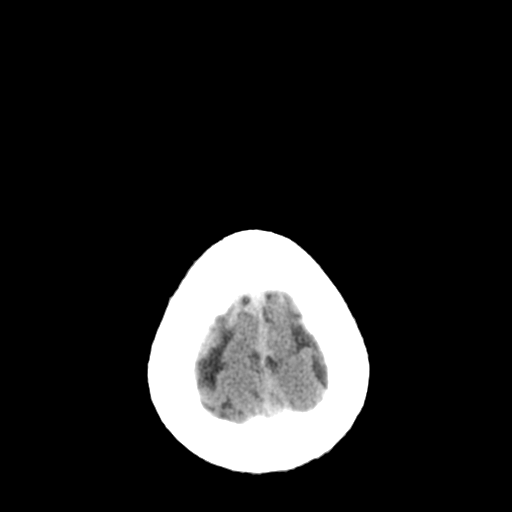
[im 27/36  bone]
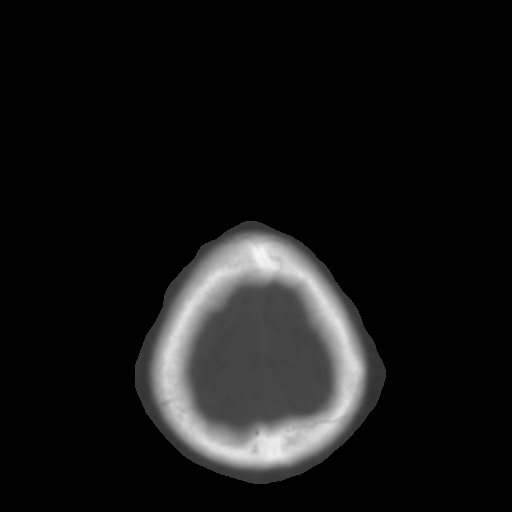
[im 29/36  brain]
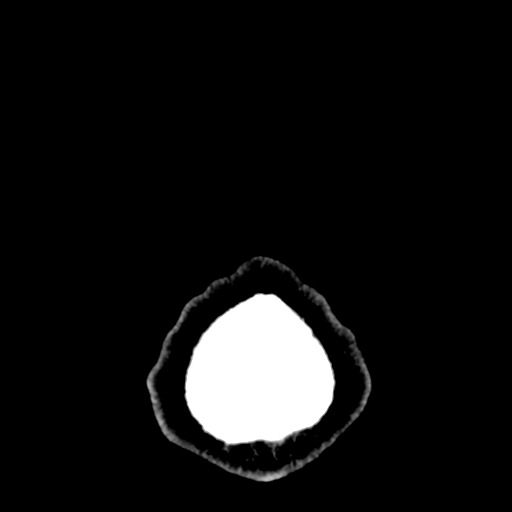
[im 32/36  brain]
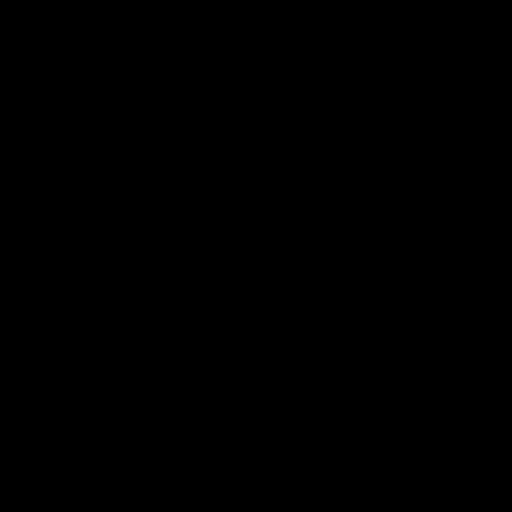
[im 34/36  brain]
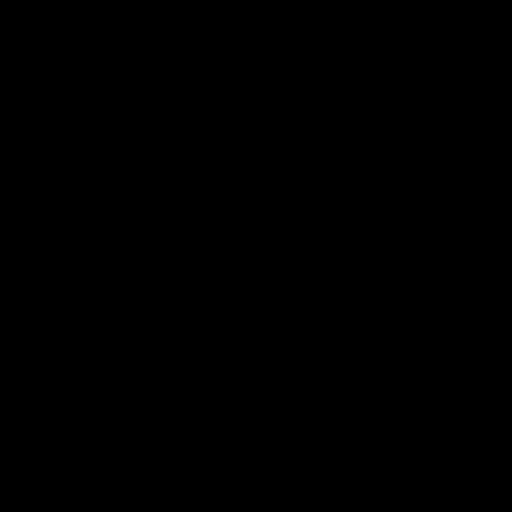

[16 of 30 positions shown; findings below may reference images not displayed]

FINDINGS: There is no evidence for acute infarction, intracranial
hemorrhage, mass lesion, hydrocephalus, or extra-axial fluid. There
is no atrophy or white matter disease.  Calvarium is intact.
Sinuses and mastoids are clear.  Compared to priors there is no
change.
IMPRESSION: Negative exam.

## 2016-01-28 ENCOUNTER — Emergency Department (HOSPITAL_COMMUNITY)
Admission: EM | Admit: 2016-01-28 | Discharge: 2016-01-28 | Disposition: A | Discharge status: Home / Self Care | Payer: 59 | Attending: Emergency Medicine | Admitting: Emergency Medicine

## 2016-01-28 ENCOUNTER — Encounter (HOSPITAL_COMMUNITY): Payer: Self-pay | Admitting: *Deleted

## 2016-01-28 ENCOUNTER — Emergency Department (HOSPITAL_COMMUNITY): Payer: 59

## 2016-01-28 DIAGNOSIS — M546 Pain in thoracic spine: Secondary | ICD-10-CM | POA: Insufficient documentation

## 2016-01-28 DIAGNOSIS — Z87891 Personal history of nicotine dependence: Secondary | ICD-10-CM | POA: Insufficient documentation

## 2016-01-28 DIAGNOSIS — M545 Low back pain, unspecified: Secondary | ICD-10-CM

## 2016-01-28 MED ORDER — OXYCODONE-ACETAMINOPHEN 5-325 MG PO TABS: 1.0000 | ORAL_TABLET | ORAL | Status: AC | PRN

## 2016-01-28 MED ORDER — CYCLOBENZAPRINE HCL 10 MG PO TABS
10.0000 mg | ORAL_TABLET | Freq: Once | ORAL | Status: AC
  Administered 2016-01-28: 10 mg via ORAL
  Filled 2016-01-28: qty 1

## 2016-01-28 MED ORDER — OXYCODONE-ACETAMINOPHEN 5-325 MG PO TABS
1.0000 | ORAL_TABLET | Freq: Once | ORAL | Status: AC
  Administered 2016-01-28: 1 via ORAL
  Filled 2016-01-28: qty 1

## 2016-01-28 MED ORDER — CYCLOBENZAPRINE HCL 10 MG PO TABS: 10.0000 mg | ORAL_TABLET | Freq: Three times a day (TID) | ORAL | Status: AC | PRN

## 2016-01-28 MED ORDER — PREDNISONE 10 MG PO TABS: ORAL_TABLET | ORAL | Status: AC

## 2016-01-28 NOTE — ED Notes (Signed)
Pt c/o lower right sided back pain/tightness. Pt states he can move in a different position and he has shooting pain. Pt denies any other symptoms including urinary.

## 2016-01-28 NOTE — Discharge Instructions (Signed)

## 2016-01-28 NOTE — ED Provider Notes (Signed)
CSN: [id number]     Arrival date & time 01/28/16  2042 History   First MD Initiated Contact with Patient 01/28/16 2058     Chief Complaint  Patient presents with  . Back Pain     (Consider location/radiation/quality/duration/timing/severity/associated sxs/prior Treatment) HPI   Marcine Matardward Furia is a 45 y.o. male who presents to the Emergency Department complaining of recurrent, intermittent pain to both sides of his lower back for " a right good while".  He also developed pain to his right mid back for one week.  He describes the pain as "tightness that comes and goes."  Pain to the area is associated with certain movements and improves with rest.  He has been taking ibuprofen with mild relief.  He denies recent injury, numbness, weakness or pain to the LE's, urine or bowel changes, abdominal pain, chest pain, shortness of breath and fever.    Past Medical History  Diagnosis Date  . Bronchitis    History reviewed. No pertinent past surgical history. History reviewed. No pertinent family history. Social History  Substance Use Topics  . Smoking status: Former Games developermoker  . Smokeless tobacco: None  . Alcohol Use: No    Review of Systems  Constitutional: Negative for fever.  Respiratory: Negative for shortness of breath.   Gastrointestinal: Negative for vomiting, abdominal pain and constipation.  Genitourinary: Negative for dysuria, hematuria, flank pain, decreased urine volume and difficulty urinating.  Musculoskeletal: Positive for back pain. Negative for joint swelling.  Skin: Negative for rash.  Neurological: Negative for weakness and numbness.  All other systems reviewed and are negative.     Allergies  Penicillins  Home Medications   Prior to Admission medications   Medication Sig Start Date End Date Taking? Authorizing Provider  albuterol (PROVENTIL HFA;VENTOLIN HFA) 108 (90 BASE) MCG/ACT inhaler Inhale 2 puffs into the lungs every 6 (six) hours as needed.    Historical  Provider, MD   BP 119/90 mmHg  Pulse 91  Temp(Src) 97.7 F (36.5 C) (Oral)  Resp 20  Ht 5\' 10"  (1.778 m)  Wt 131.543 kg  BMI 41.61 kg/m2  SpO2 98% Physical Exam  Constitutional: He is oriented to person, place, and time. He appears well-developed and well-nourished. No distress.  HENT:  Head: Normocephalic and atraumatic.  Neck: Normal range of motion. Neck supple.  Cardiovascular: Normal rate, regular rhythm, normal heart sounds and intact distal pulses.   No murmur heard. Pulmonary/Chest: Effort normal and breath sounds normal. No respiratory distress. He exhibits no tenderness.  Abdominal: Soft. He exhibits no distension. There is no tenderness. There is no rebound and no guarding.  Musculoskeletal: He exhibits tenderness. He exhibits no edema.       Lumbar back: He exhibits tenderness and pain. He exhibits normal range of motion, no swelling, no deformity, no laceration and normal pulse.  ttp of the bilateral lumbar paraspinal muscles and right lower thoracic paraspinal muscles.  No spinal tenderness.  DP pulses are brisk and symmetrical.  Distal sensation intact.  Pt has 5/5 strength against resistance of bilateral lower extremities.     Neurological: He is alert and oriented to person, place, and time. He has normal strength. No sensory deficit. He exhibits normal muscle tone. Coordination and gait normal.  Reflex Scores:      Patellar reflexes are 2+ on the right side and 2+ on the left side.      Achilles reflexes are 2+ on the right side and 2+ on the left side. Skin: Skin  is warm and dry. No rash noted.  Nursing note and vitals reviewed.   ED Course  Procedures (including critical care time) Labs Review Labs Reviewed - No data to display  Imaging Review Dg Thoracic Spine 2 View  01/28/2016  CLINICAL DATA:  Initial evaluation for acute right-sided back pain, tightness. EXAM: THORACIC SPINE 2 VIEWS COMPARISON:  None. FINDINGS: Mild dextroscoliosis present. Vertebral  body heights well maintained. No fracture or listhesis. Mild degenerative spondylolysis within the lower thoracic spine. Visualized soft tissues unremarkable. Visualized heart and lungs are clear. IMPRESSION: 1. No radiographic evidence for acute abnormality within the thoracic spine. 2. 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 Electronically Signed   By: Rise Mu M.D.   On: 01/28/2016 22:18   Dg Lumbar Spine Complete  01/28/2016  CLINICAL DATA:  Initial valuation for acute right lower low back pain with tightness. EXAM: LUMBAR SPINE - COMPLETE 4+ VIEW COMPARISON:  None. FINDINGS: Six non rib-bearing lumbar type vertebral bodies present. Vertebral body height preserved. Vertebral bodies normally aligned with preservation of the normal lumbar lordosis. No acute fracture or listhesis. Degenerative spondylolysis present at T11-12 and T12-L1. Mild degenerative intervertebral disc space narrowing at L5-S1 as well. Bilateral facet arthrosis at L4-5 and L5-S1. SI joints symmetric and normal in appearance. Visualized soft tissues within normal limits. IMPRESSION: 1. No radiographic evidence for acute abnormality within the lumbar spine. 2. 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 3. Bilateral facet arthrosis at L4-5 and L5-S1 peer Electronically Signed   By: Rise Mu M.D.   On: 01/28/2016 22:15   I have personally reviewed and evaluated these images and lab results as part of my medical decision-making.   EKG Interpretation None      MDM   Final diagnoses:  Right-sided thoracic back pain  Bilateral low back pain without sciatica     Pt ambulates with a steady gait.  No focal neuro deficits.  No concerning symptoms for emergent neurological or infectious process.  XR results discussed with pateint.  Pt agrees to symptomatic tx and close PMD f/u if not improving.  rx for  prednisone, flexeril and percocet   Demico Ploch, PA-C 01/29/16 0105  Vanetta Mulders, MD 01/29/16 1737
